# Patient Record
Sex: Female | Born: 1965 | Race: Black or African American | Hispanic: No | Smoking: Never smoker
Health system: Southern US, Community
[De-identification: ages and names within clinical notes are randomized; demographics above are authoritative.]

## PROBLEM LIST (undated history)

## (undated) HISTORY — PX: OTHER SURGICAL HISTORY: SHX169

## (undated) HISTORY — PX: ABDOMINAL HYSTERECTOMY: SHX81

---

## 2016-09-06 ENCOUNTER — Ambulatory Visit (INDEPENDENT_AMBULATORY_CARE_PROVIDER_SITE_OTHER)
Admission: EM | Admit: 2016-09-06 | Discharge: 2016-09-06 | Disposition: A | Discharge status: Home / Self Care | Payer: Self-pay | Source: Home / Self Care | Attending: Family Medicine | Admitting: Family Medicine

## 2016-09-06 ENCOUNTER — Ambulatory Visit (INDEPENDENT_AMBULATORY_CARE_PROVIDER_SITE_OTHER): Payer: Self-pay

## 2016-09-06 DIAGNOSIS — M5442 Lumbago with sciatica, left side: Secondary | ICD-10-CM

## 2016-09-06 DIAGNOSIS — M549 Dorsalgia, unspecified: Secondary | ICD-10-CM

## 2016-09-06 DIAGNOSIS — M5441 Lumbago with sciatica, right side: Secondary | ICD-10-CM

## 2016-09-06 DIAGNOSIS — G8929 Other chronic pain: Secondary | ICD-10-CM

## 2016-09-06 MED ORDER — CYCLOBENZAPRINE HCL 10 MG PO TABS: 10.0000 mg | ORAL_TABLET | Freq: Two times a day (BID) | ORAL | 0 refills | Status: DC | PRN

## 2016-09-06 MED ORDER — OXYCODONE-ACETAMINOPHEN 10-325 MG PO TABS
1.0000 | ORAL_TABLET | ORAL | 0 refills | Status: DC | PRN
Start: 2016-09-06 — End: 2017-02-22

## 2016-09-06 MED ORDER — DICLOFENAC POTASSIUM 50 MG PO TABS: 50.0000 mg | ORAL_TABLET | Freq: Three times a day (TID) | ORAL | 0 refills | Status: DC

## 2016-09-06 NOTE — Discharge Instructions (Signed)
No structural injury to the back is noted on the xray  If your symptoms persist you may need to have a repeat MRI.
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
## 2016-09-06 NOTE — ED Triage Notes (Signed)
[facility] of [facility] pain that she described as chronic but has gotten worse in the last week.

## 2018-02-22 ENCOUNTER — Ambulatory Visit (INDEPENDENT_AMBULATORY_CARE_PROVIDER_SITE_OTHER): Payer: 59

## 2018-02-22 ENCOUNTER — Ambulatory Visit (HOSPITAL_COMMUNITY)
Admission: EM | Admit: 2018-02-22 | Discharge: 2018-02-22 | Disposition: A | Discharge status: Home / Self Care | Payer: 59 | Attending: Family Medicine | Admitting: Family Medicine

## 2018-02-22 ENCOUNTER — Encounter (HOSPITAL_COMMUNITY): Payer: Self-pay | Admitting: Emergency Medicine

## 2018-02-22 DIAGNOSIS — S161XXA Strain of muscle, fascia and tendon at neck level, initial encounter: Secondary | ICD-10-CM

## 2018-02-22 DIAGNOSIS — M436 Torticollis: Secondary | ICD-10-CM

## 2018-02-22 DIAGNOSIS — G8929 Other chronic pain: Secondary | ICD-10-CM

## 2018-02-22 DIAGNOSIS — M545 Low back pain: Secondary | ICD-10-CM

## 2018-02-22 MED ORDER — KETOROLAC TROMETHAMINE 60 MG/2ML IM SOLN
60.0000 mg | Freq: Once | INTRAMUSCULAR | Status: AC
  Administered 2018-02-22: 60 mg via INTRAMUSCULAR

## 2018-02-22 MED ORDER — PREDNISONE 50 MG PO TABS: 50.0000 mg | ORAL_TABLET | Freq: Every day | ORAL | 0 refills | Status: AC

## 2018-02-22 MED ORDER — DICLOFENAC SODIUM 75 MG PO TBEC: 75.0000 mg | DELAYED_RELEASE_TABLET | Freq: Two times a day (BID) | ORAL | 0 refills | Status: AC

## 2018-02-22 MED ORDER — KETOROLAC TROMETHAMINE 60 MG/2ML IM SOLN
INTRAMUSCULAR | Status: AC
  Filled 2018-02-22: qty 2

## 2018-02-22 MED ORDER — CYCLOBENZAPRINE HCL 10 MG PO TABS: 10.0000 mg | ORAL_TABLET | Freq: Two times a day (BID) | ORAL | 0 refills | Status: DC | PRN

## 2018-02-22 NOTE — Discharge Instructions (Addendum)
We gave you a shot of Toradol today.  This should begin to provide relief in 30-45 minutes.  Please take diclofenac twice daily. OR You may take up to 800 mg Ibuprofen every 8 hours with food. You may supplement Ibuprofen with Tylenol [phone] mg every 8 hours.   Take prednisone 50 mg for the next 5 days.  This should help with any inflammation.  Please use Flexeril muscle relaxer as needed every 12 hours.  This may cause some sedation.  Please use when at home and do not drive after.  Please use ice and heat on back and neck to help with pain and discomfort.
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
## 2018-02-22 NOTE — ED Triage Notes (Signed)
PT reports lower back pain that shoots up her spine with neck pain and stiffness for 1 week. No injury

## 2018-07-22 IMAGING — DX DG CERVICAL SPINE COMPLETE 4+V
6 series · 6 of 6 positions shown · non-contrast
Comparison: None in PACs

CLINICAL DATA: Four days of neck pain with no known injury. The
patient reports awakening with the pain.

EXAM:
CERVICAL SPINE - COMPLETE 4+ VIEW

[c-spine lat]
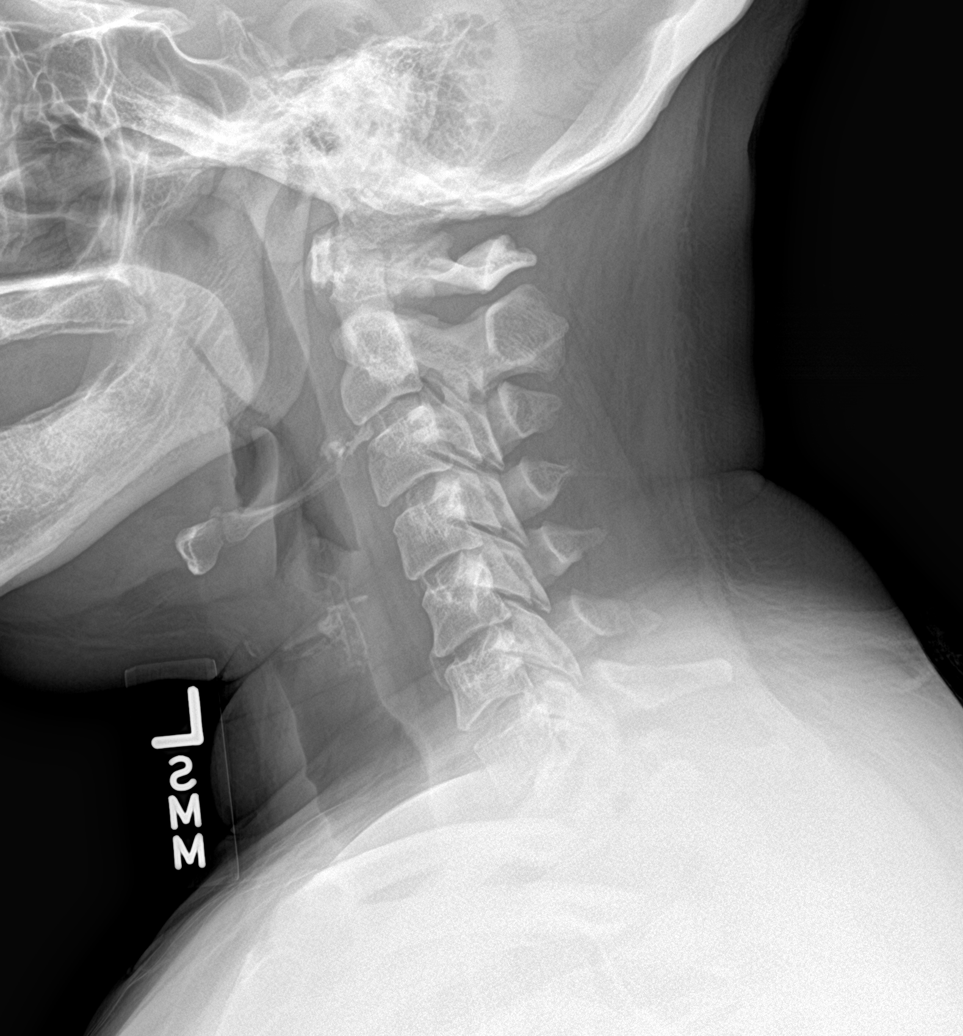

[c-spine obl (1 of 2)]
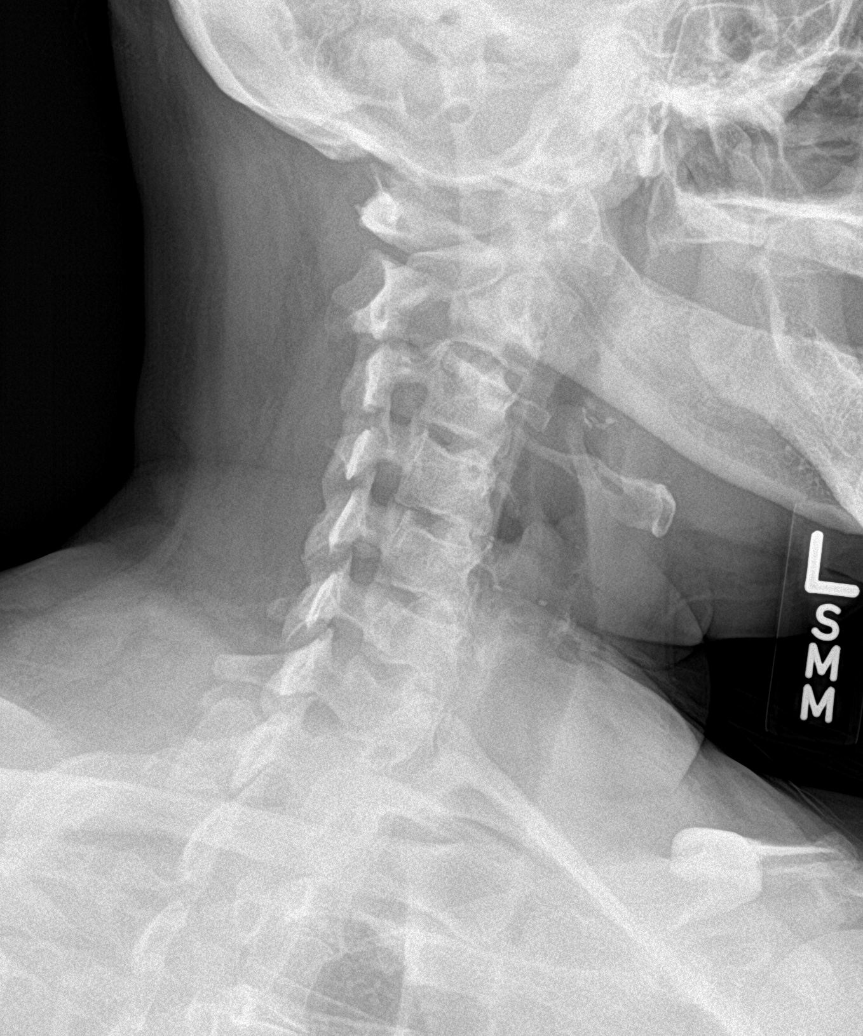

[c-spine obl (2 of 2)]
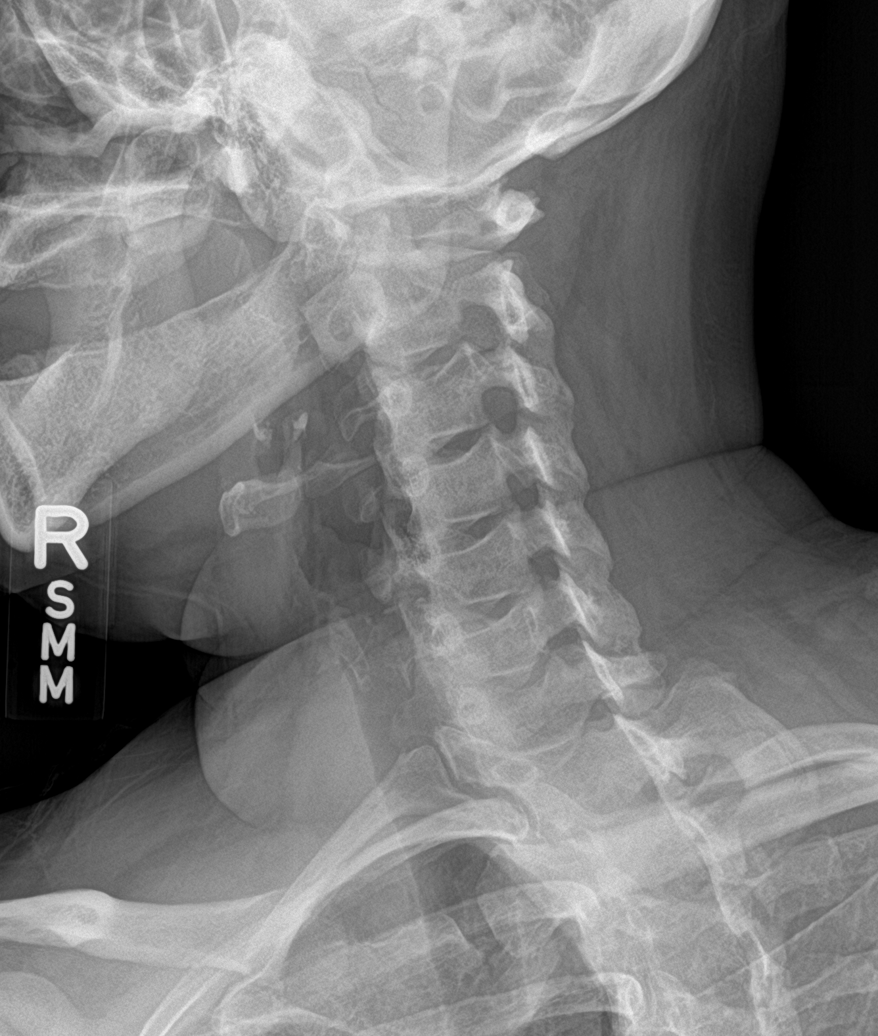

[c-spine ap]
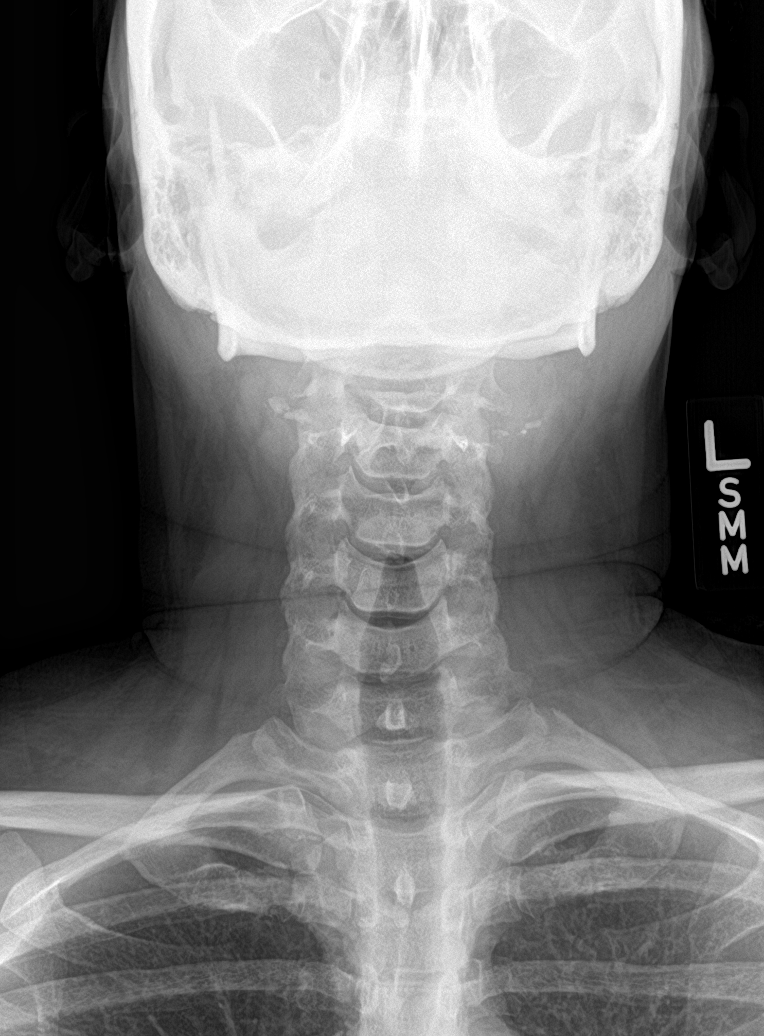

[c-spine open mouth]
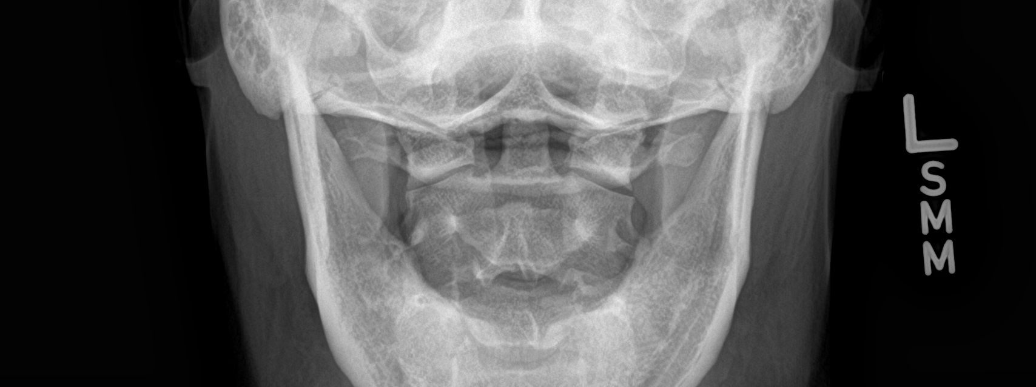

[c-spine swimmers]
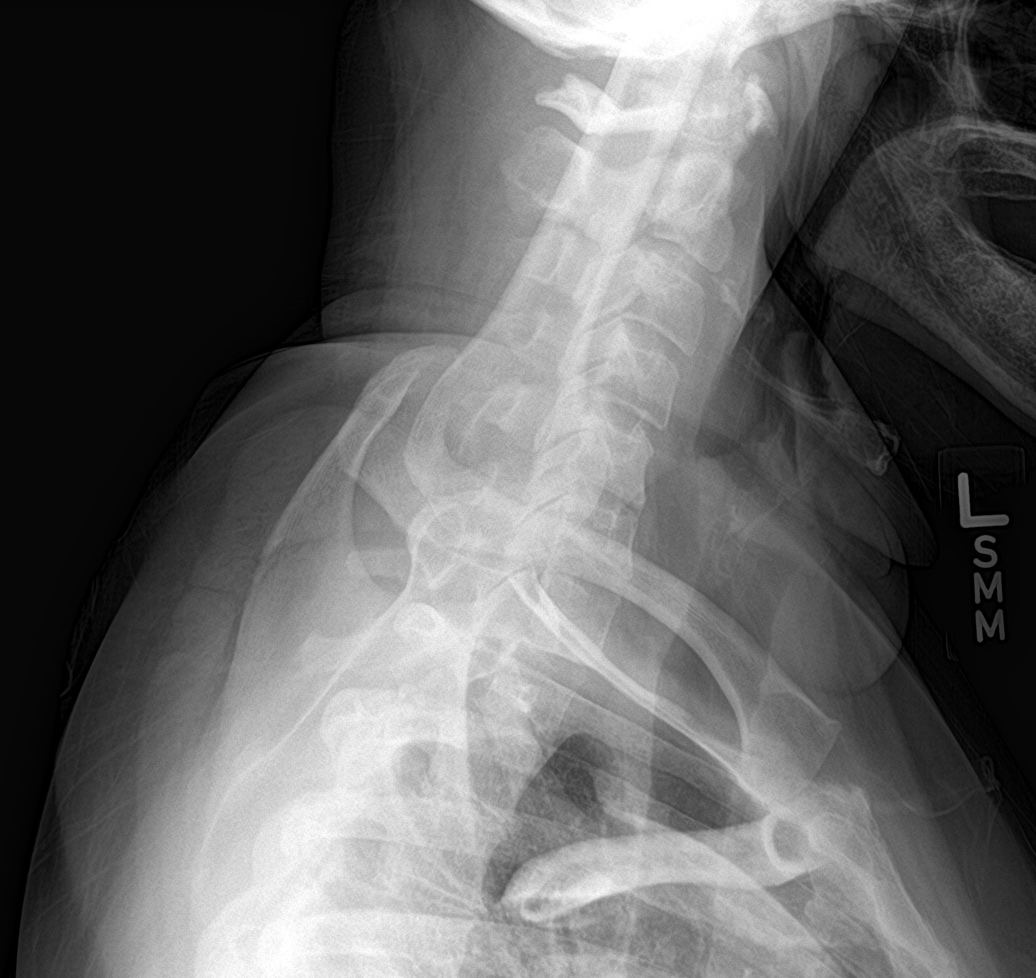

[6 of 6 positions shown; findings below may reference images not displayed]

FINDINGS: There is mild loss of the normal cervical lordosis. The vertebral
bodies are preserved in height. The disc space heights are
reasonably well-maintained. There is no perched facet or spinous
process fracture. The oblique views reveal no significant bony
encroachment upon the neural foramina. The odontoid is intact where
visualized. The prevertebral soft tissue spaces are normal.
IMPRESSION: There is no acute or significant chronic bony abnormality of the
cervical spine.

## 2020-09-15 ENCOUNTER — Encounter (HOSPITAL_COMMUNITY): Payer: Self-pay

## 2020-09-15 ENCOUNTER — Other Ambulatory Visit: Payer: Self-pay

## 2020-09-15 ENCOUNTER — Ambulatory Visit (HOSPITAL_COMMUNITY)
Admission: EM | Admit: 2020-09-15 | Discharge: 2020-09-15 | Disposition: A | Discharge status: Home / Self Care | Payer: 59 | Attending: Physician Assistant | Admitting: Physician Assistant

## 2020-09-15 DIAGNOSIS — M5441 Lumbago with sciatica, right side: Secondary | ICD-10-CM | POA: Diagnosis not present

## 2020-09-15 DIAGNOSIS — R1013 Epigastric pain: Secondary | ICD-10-CM | POA: Diagnosis present

## 2020-09-15 DIAGNOSIS — G8929 Other chronic pain: Secondary | ICD-10-CM | POA: Diagnosis present

## 2020-09-15 DIAGNOSIS — R1010 Upper abdominal pain, unspecified: Secondary | ICD-10-CM | POA: Diagnosis present

## 2020-09-15 LAB — CBC WITH DIFFERENTIAL/PLATELET
Abs Immature Granulocytes: 0.03 10*3/uL (ref 0.00–0.07)
Basophils Absolute: 0 10*3/uL (ref 0.0–0.1)
Basophils Relative: 0 %
Eosinophils Absolute: 0.1 10*3/uL (ref 0.0–0.5)
Eosinophils Relative: 1 %
HCT: 48.4 % — ABNORMAL HIGH (ref 36.0–46.0)
Hemoglobin: 16.1 g/dL — ABNORMAL HIGH (ref 12.0–15.0)
Immature Granulocytes: 0 %
Lymphocytes Relative: 33 %
Lymphs Abs: 2.6 10*3/uL (ref 0.7–4.0)
MCH: 29.5 pg (ref 26.0–34.0)
MCHC: 33.3 g/dL (ref 30.0–36.0)
MCV: 88.8 fL (ref 80.0–100.0)
Monocytes Absolute: 0.5 10*3/uL (ref 0.1–1.0)
Monocytes Relative: 6 %
Neutro Abs: 4.6 10*3/uL (ref 1.7–7.7)
Neutrophils Relative %: 60 %
Platelets: 255 10*3/uL (ref 150–400)
RBC: 5.45 MIL/uL — ABNORMAL HIGH (ref 3.87–5.11)
RDW: 14.2 % (ref 11.5–15.5)
WBC: 7.7 10*3/uL (ref 4.0–10.5)
nRBC: 0 % (ref 0.0–0.2)

## 2020-09-15 LAB — COMPREHENSIVE METABOLIC PANEL
ALT: 14 U/L (ref 0–44)
AST: 16 U/L (ref 15–41)
Albumin: 4.3 g/dL (ref 3.5–5.0)
Alkaline Phosphatase: 101 U/L (ref 38–126)
Anion gap: 12 (ref 5–15)
BUN: 7 mg/dL (ref 6–20)
CO2: 24 mmol/L (ref 22–32)
Calcium: 9.7 mg/dL (ref 8.9–10.3)
Chloride: 103 mmol/L (ref 98–111)
Creatinine, Ser: 0.96 mg/dL (ref 0.44–1.00)
GFR calc Af Amer: >60 mL/min (ref >60)
GFR calc non Af Amer: >60 mL/min (ref >60)
Glucose, Bld: 93 mg/dL (ref 70–99)
Potassium: 4 mmol/L (ref 3.5–5.1)
Sodium: 139 mmol/L (ref 135–145)
Total Bilirubin: 1 mg/dL (ref 0.3–1.2)
Total Protein: 7.8 g/dL (ref 6.5–8.1)

## 2020-09-15 LAB — LIPASE, BLOOD: Lipase: 42 U/L (ref 11–51)

## 2020-09-15 MED ORDER — PANTOPRAZOLE SODIUM 20 MG PO TBEC: 20.0000 mg | DELAYED_RELEASE_TABLET | Freq: Every day | ORAL | 0 refills | Status: AC

## 2020-09-15 MED ORDER — DICLOFENAC SODIUM 1 % EX GEL: 4.0000 g | Freq: Four times a day (QID) | CUTANEOUS | 0 refills | Status: AC

## 2020-09-15 MED ORDER — ACETAMINOPHEN 325 MG PO TABS: 650.0000 mg | ORAL_TABLET | Freq: Four times a day (QID) | ORAL | 0 refills | Status: AC | PRN

## 2020-09-15 MED ORDER — TIZANIDINE HCL 4 MG PO TABS: 4.0000 mg | ORAL_TABLET | Freq: Every day | ORAL | 0 refills | Status: AC

## 2020-09-15 NOTE — Discharge Instructions (Addendum)
We have sent labs, we will call if we need discussing these, if they are normal you will not hear back from you today and will be in your MyChart.  Start the Protonix as prescribed -Minimize spicy foods, alcohol and other triggers  Take the Tylenol as prescribed Take the muscle relaxer which is tizanidine/Zanaflex at night, this will make you sleepy do not drive, drink alcohol or operate machinery within 8 hours of taking this. Apply the topical cream to the area of pain  If your belly pain or become severe, you develop fever and vomiting go to the emergency department  Follow-up with your primary care, contact for sooner follow-up if possible.

## 2020-09-15 NOTE — ED Triage Notes (Signed)
Pt presents with lower back pain radiates to right leg x 23 years; acid reflux x 3 years. Pt states she is having difficulty sleeping because of the pain.  Pain is worse when sitting for extended period of time, "feel burnings". Pt states the acid reflux is worse when eating spicy food and coffee.

## 2020-09-15 NOTE — ED Provider Notes (Signed)
[facility]    CSN: [id number] Arrival date & time: 09/15/20  1043      History   Chief Complaint Chief Complaint  Patient presents with  . Back Pain  . Gastroesophageal Reflux    HPI Tracy Booker is a 54 y.o. female.   Patient reports for low back pain as well as "acid reflux".  She reports she has had issues with her back for 23 years.  She reports she has pain most of the time but then she will have flares.  Most recent flare was 5 days ago.  She reports pain primarily in the right side of the low back.  This is radiating into her right leg.  She reports this is keeping her up over the last couple nights.  She reports in the past she has been treated for similar some things work and other things did not.  She has not tried any medicines over-the-counter or recent back pain.  Reports some tingling in the right leg.  Otherwise denies numbness or weakness.  Denies loss of bowel or bladder control.  No numbness between her legs.  She has not had any recent traumas or falls.  She also reports she has having acid reflux symptoms.  She has had issues with this for 3 years.  She reports upper belly burning pain.  She reports this is been a long term issue.  She reports this comes and goes and is worse at times.  She reports spicy food and coffee makes it worse.  She denies any nausea or vomiting.  She does endorse occasionally having some regurgitation.  Denies fevers.  Food does not directly always make it worse.  She reports occasional alcohol use.  She reports this is not necessarily a new symptom.  Reports low level 5 out of 10 burning pain, that is not distressing to her.  She reports she has her gallbladder.  She denies shortness of breath.  Denies dark tarry stools.  Denies blood in her stool.  Occasional diarrhea, otherwise moves her bowels normally once a day.  She is awaiting an appointment with a primary care as a new patient in 1 to 2 weeks.     History reviewed.  No pertinent past medical history.  There are no problems to display for this patient.   Past Surgical History:  Procedure Laterality Date  . ABDOMINAL HYSTERECTOMY    . c section      OB History   No obstetric history on file.      Home Medications    Prior to Admission medications   Medication Sig Start Date End Date Taking? Authorizing Provider  acetaminophen (TYLENOL) 325 MG tablet Take 2 tablets (650 mg total) by mouth every 6 (six) hours as needed. 09/15/20   Tanyla Stege, Veryl Speak, PA-C  diclofenac Sodium (VOLTAREN) 1 % GEL Apply 4 g topically 4 (four) times daily. 09/15/20   Tarryn Bogdan, Veryl Speak, PA-C  pantoprazole (PROTONIX) 20 MG tablet Take 1 tablet (20 mg total) by mouth daily for 14 days. 09/15/20 09/29/20  Bev Drennen, Veryl Speak, PA-C  QUEtiapine (SEROQUEL) 100 MG tablet Take 50 mg by mouth at bedtime.    [provider]  tiZANidine (ZANAFLEX) 4 MG tablet Take 1 tablet (4 mg total) by mouth at bedtime for 10 days. 09/15/20 09/25/20  Shaunna Rosetti, Veryl Speak, PA-C    Family History History reviewed. No pertinent family history.  Social History Social History   Tobacco Use  . Smoking status: Never  Smoker  . Smokeless tobacco: Never Used  Substance Use Topics  . Alcohol use: Never  . Drug use: Never     Allergies   Patient has no known allergies.   Review of Systems Review of Systems   Physical Exam Triage Vital Signs ED Triage Vitals  Enc Vitals Group     BP 09/15/20 1231 (!) 153/111     Pulse Rate 09/15/20 1231 84     Resp 09/15/20 1231 20     Temp 09/15/20 1231 98.7 F (37.1 C)     Temp Source 09/15/20 1231 Oral     SpO2 09/15/20 1231 100 %     Weight --      Height --      Head Circumference --      Peak Flow --      Pain Score 09/15/20 1229 8     Pain Loc --      Pain Edu? --      Excl. in GC? --    No data found.  Updated Vital Signs BP (!) 153/111 (BP Location: Right Arm)   Pulse 84   Temp 98.7 F (37.1 C) (Oral)   Resp 20   SpO2 100%   Visual  Acuity Right Eye Distance:   Left Eye Distance:   Bilateral Distance:    Right Eye Near:   Left Eye Near:    Bilateral Near:     Physical Exam Vitals and nursing note reviewed.  Constitutional:      General: She is not in acute distress.    Appearance: Normal appearance. She is well-developed. She is not ill-appearing or diaphoretic.  HENT:     Head: Normocephalic and atraumatic.  Eyes:     General: No scleral icterus.    Conjunctiva/sclera: Conjunctivae normal.  Cardiovascular:     Rate and Rhythm: Normal rate and regular rhythm.     Heart sounds: No murmur heard.   Pulmonary:     Effort: Pulmonary effort is normal. No respiratory distress.     Breath sounds: Normal breath sounds. No wheezing, rhonchi or rales.  Abdominal:     Palpations: Abdomen is soft.     Tenderness: There is abdominal tenderness (Epigastric, right upper and left upper quadrant tenderness.).     Comments: Abdomen is soft.  Nondistended.  No pain with percussion.  No rebound.  No tenderness throughout the lower abdomen.  No masses appreciated.  No definitive organomegaly.  Musculoskeletal:     Cervical back: Normal range of motion and neck supple.     Comments: There is no midline tenderness of the cervical, thoracic or lumbar spine.  She has primarily right-sided paralumbar spinal tenderness.  Straight leg raise mildly positive.  Has full range of motion of the lower extremities.  Is ambulatory.  Strength is 5/5 in the lower extremities.  Sensation grossly intact with possible slight diminished right versus left.  Distal pulses 2+.  Cap refill less than 2 seconds.  Skin:    General: Skin is warm and dry.  Neurological:     General: No focal deficit present.     Mental Status: She is alert and oriented to person, place, and time.      UC Treatments / Results  Labs (all labs ordered are listed, but only abnormal results are displayed) Labs Reviewed  CBC WITH DIFFERENTIAL/PLATELET  COMPREHENSIVE  METABOLIC PANEL  LIPASE, BLOOD    EKG   Radiology No results found.  Procedures Procedures (including critical care  time)  Medications Ordered in UC Medications - No data to display  Initial Impression / Assessment and Plan / UC Course  I have reviewed the triage vital signs and the nursing notes.  Pertinent labs & imaging results that were available during my care of the patient were reviewed by me and considered in my medical decision making (see chart for details).     #Chronic right-sided low back pain #Dyspepsia #Upper abdominal pain Patient is a 54 year old without routine health care presenting with chronic right-sided low back pain, upper abdominal pain and dyspepsia.  She is afebrile, nontachycardic and not in distress in clinic.  CBC, CMP and lipase all returned reassuring.  Less likely this is biliary, hepatic or pancreatic.  Likely uncontrolled GERD, possible ulcer.  Will start on pantoprazole.  Back pain is mechanical.  Will avoid NSAIDs.  Tylenol, muscle relaxers and topical Voltaren prescribed for back pain.  Sports medicine follow-up recommended.  Encourage patient to have close follow-up with primary care.  Discussed emergency department precautions for any worsening abdominal pain.  Patient verbalized agreement and understanding plan of care.  Final Clinical Impressions(s) / UC Diagnoses   Final diagnoses:  Chronic right-sided low back pain with right-sided sciatica  Dyspepsia     Discharge Instructions     We have sent labs, we will call if we need discussing these, if they are normal you will not hear back from you today and will be in your MyChart.  Start the Protonix as prescribed -Minimize spicy foods, alcohol and other triggers  Take the Tylenol as prescribed Take the muscle relaxer which is tizanidine/Zanaflex at night, this will make you sleepy do not drive, drink alcohol or operate machinery within 8 hours of taking this. Apply the topical cream  to the area of pain  If your belly pain or become severe, you develop fever and vomiting go to the emergency department  Follow-up with your primary care, contact for sooner follow-up if possible.      ED Prescriptions    Medication Sig Dispense Auth. Provider   pantoprazole (PROTONIX) 20 MG tablet Take 1 tablet (20 mg total) by mouth daily for 14 days. 14 tablet Merritt Mccravy, Veryl Speak, PA-C   tiZANidine (ZANAFLEX) 4 MG tablet Take 1 tablet (4 mg total) by mouth at bedtime for 10 days. 10 tablet Ellie Bryand, Veryl Speak, PA-C   acetaminophen (TYLENOL) 325 MG tablet Take 2 tablets (650 mg total) by mouth every 6 (six) hours as needed. 30 tablet Danaiya Steadman, Veryl Speak, PA-C   diclofenac Sodium (VOLTAREN) 1 % GEL Apply 4 g topically 4 (four) times daily. 100 g Kammie Scioli, Veryl Speak, PA-C     I have reviewed the PDMP during this encounter.   Hermelinda Medicus, PA-C 09/15/20 1616

## 2022-08-24 ENCOUNTER — Emergency Department (HOSPITAL_COMMUNITY)
Admission: EM | Admit: 2022-08-24 | Discharge: 2022-08-24 | Discharge status: Against Medical Advice | Payer: Self-pay | Attending: Emergency Medicine | Admitting: Emergency Medicine

## 2022-08-24 ENCOUNTER — Emergency Department (HOSPITAL_COMMUNITY): Payer: Self-pay

## 2022-08-24 ENCOUNTER — Encounter (HOSPITAL_COMMUNITY): Payer: Self-pay

## 2022-08-24 ENCOUNTER — Other Ambulatory Visit: Payer: Self-pay

## 2022-08-24 DIAGNOSIS — R55 Syncope and collapse: Secondary | ICD-10-CM | POA: Insufficient documentation

## 2022-08-24 DIAGNOSIS — M25511 Pain in right shoulder: Secondary | ICD-10-CM | POA: Insufficient documentation

## 2022-08-24 DIAGNOSIS — Z5321 Procedure and treatment not carried out due to patient leaving prior to being seen by health care provider: Secondary | ICD-10-CM | POA: Insufficient documentation

## 2022-08-24 LAB — CBC
HCT: 42.4 % (ref 36.0–46.0)
Hemoglobin: 14.2 g/dL (ref 12.0–15.0)
MCH: 30.1 pg (ref 26.0–34.0)
MCHC: 33.5 g/dL (ref 30.0–36.0)
MCV: 90 fL (ref 80.0–100.0)
Platelets: 244 10*3/uL (ref 150–400)
RBC: 4.71 MIL/uL (ref 3.87–5.11)
RDW: 14.4 % (ref 11.5–15.5)
WBC: 7.6 10*3/uL (ref 4.0–10.5)
nRBC: 0 % (ref 0.0–0.2)

## 2022-08-24 LAB — CBG MONITORING, ED: Glucose-Capillary: 106 mg/dL — ABNORMAL HIGH (ref 70–99)

## 2022-08-24 LAB — BASIC METABOLIC PANEL
Anion gap: 8 (ref 5–15)
BUN: 8 mg/dL (ref 6–20)
CO2: 27 mmol/L (ref 22–32)
Calcium: 9.5 mg/dL (ref 8.9–10.3)
Chloride: 105 mmol/L (ref 98–111)
Creatinine, Ser: 0.79 mg/dL (ref 0.44–1.00)
GFR, Estimated: >60 mL/min (ref >60)
Glucose, Bld: 109 mg/dL — ABNORMAL HIGH (ref 70–99)
Potassium: 4 mmol/L (ref 3.5–5.1)
Sodium: 140 mmol/L (ref 135–145)

## 2022-08-24 LAB — TROPONIN I (HIGH SENSITIVITY): Troponin I (High Sensitivity): 2 ng/L (ref <18)

## 2022-08-24 NOTE — ED Triage Notes (Signed)
Pt reports R sided chest pain that began about a month ago. Pt reports she passed out today at work. Denies abdominal pain and N/V. Denies injury when passing out.

## 2022-08-24 NOTE — ED Notes (Signed)
Pt in restroom at this time, will obtain EKG when pt returns

## 2022-08-24 NOTE — ED Provider Triage Note (Signed)
Emergency Medicine Provider Triage Evaluation Note  Tracy Booker , a 56 y.o. female  was evaluated in triage.  Pt complains of syncopal episode and right shoulder pain.  Patient states her right shoulder has been hurting for as long as 2 months.  She believes she fell approximately 2 months ago.  She states she was sitting at her desk when she started to feel lightheaded and fell.  Her mother found her on the floor.  Unknown downtime.  She complains of continued pain in the right shoulder.  Denies chest pain, shortness of breath, lightheadedness, headache, abdominal pain, nausea, vomiting  Review of Systems  Positive: As above Negative: As above  Physical Exam  BP (!) 131/95 (BP Location: Left Arm)   Pulse 87   Temp 98.7 F (37.1 C) (Oral)   Resp 16   SpO2 100%  Gen:   Awake, no distress   Resp:  Normal effort  MSK:   Moves extremities without difficulty  Other:    Medical Decision Making  Medically screening exam initiated at 4:58 PM.  Appropriate orders placed.  [name] was informed that the remainder of the evaluation will be completed by another provider, this initial triage assessment does not replace that evaluation, and the importance of remaining in the ED until their evaluation is complete.     Darrick Grinder, PA-C 08/24/22 1700

## 2022-10-16 ENCOUNTER — Other Ambulatory Visit: Payer: Self-pay | Admitting: Family Medicine

## 2022-10-16 DIAGNOSIS — Z1231 Encounter for screening mammogram for malignant neoplasm of breast: Secondary | ICD-10-CM
